# Patient Record
Sex: Male | Born: 1965 | Race: White | Hispanic: No | Marital: Married
Health system: Southern US, Community
[De-identification: ages and names within clinical notes are randomized; demographics above are authoritative.]

---

## 2002-07-17 ENCOUNTER — Encounter: Payer: Self-pay | Admitting: Occupational Medicine

## 2002-07-17 ENCOUNTER — Encounter: Admission: RE | Admit: 2002-07-17 | Discharge: 2002-07-17 | Payer: Self-pay | Admitting: Occupational Medicine

## 2002-11-30 ENCOUNTER — Encounter: Payer: Self-pay | Admitting: *Deleted

## 2002-11-30 ENCOUNTER — Encounter: Payer: Self-pay | Admitting: Emergency Medicine

## 2002-11-30 ENCOUNTER — Observation Stay (HOSPITAL_COMMUNITY): Admission: EM | Admit: 2002-11-30 | Discharge: 2002-12-01 | Payer: Self-pay | Admitting: Emergency Medicine

## 2006-12-12 ENCOUNTER — Encounter: Admission: RE | Admit: 2006-12-12 | Discharge: 2006-12-12 | Payer: Self-pay | Admitting: Gastroenterology

## 2007-01-13 ENCOUNTER — Ambulatory Visit (HOSPITAL_COMMUNITY): Admission: RE | Admit: 2007-01-13 | Discharge: 2007-01-13 | Payer: Self-pay | Admitting: Gastroenterology

## 2010-11-03 NOTE — H&P (Signed)
NAME:  Isaac Sanchez, Isaac Sanchez                     ACCOUNT NO.:  [id number]   MEDICAL RECORD NO.:  [id number]                   PATIENT TYPE:  EMS   LOCATION:  MAJO                                 FACILITY:  [facility]   PHYSICIAN:  Rollene Rotunda, M.D.                DATE OF BIRTH:  04/26/1966   DATE OF ADMISSION:  11/30/2002  DATE OF DISCHARGE:                                HISTORY & PHYSICAL   PRIMARY CARE PHYSICIAN:  None.   REASON FOR PRESENTATION:  Evaluate the patient for chest pain.   HISTORY OF PRESENT ILLNESS:  The patient is a pleasant 45 year old gentleman  who had a history of chest pain back in the late 1990s.  He was seen  apparently, by cardiologists in [facility] and had a negative stress test  per his report.  He also has a history of spontaneous left-sided  pneumothorax around that same time.  Today he presented with chest  discomfort.  This occurred while he was at work.  He was walking but not  particularly exerting himself.  He felt a left-sided chest discomfort.  He  described it as a tightness.  It was 8 out of 10 in intensity.  He felt  diaphoretic and cold.  He had lightheadedness with pre-syncope.  He did have  some tingling of his hands but he has noticed this at other times as well.  He had some nausea.  He thought this was similar to the symptoms he had 7 or  8 years ago.  A co-worker called EMS.  He was given aspirin and a sublingual  nitroglycerin.  By the time EMS had arrived the pain was 5 out of 10.  After  the sublingual nitroglycerin it eased to 2 out of 10.  A second sublingual  nitroglycerin and the pain abated.  He has had no recurrence of this.   He has otherwise been well.  Yesterday he had a headache.  However, usually  he has been feeling fine. He goes up and down stairs.  With this he cannot  bring on cardiac symptoms.   PAST MEDICAL HISTORY:  1. Spontaneous pneumothorax.  2. He has no history of hypertension, diabetes or hyperlipidemia.   PAST SURGICAL HISTORY:  None.   ALLERGIES:  Lorcet or Lortab.   MEDICATIONS:  None.   SOCIAL HISTORY:  The patient is married.  He has no children.  He works for  [employer].  He has smoked up to two packs per day.  Currently  he is smoking less than one pack per day.  He has smoked for 15 years.  He  occasionally drinks alcohol.   FAMILY HISTORY:  Noncontributory for early coronary artery disease, however,  he does not know his father or his father's cardiac history or his father's  side of the family.   REVIEW OF SYSTEMS:  As stated in the History of Present Illness and  otherwise  negative for fever, chills, night sweats, sinus trouble, cough,  nausea, vomiting, diarrhea, constipation, hematochezia, hematemesis, melena,  heat intolerance, nerves or depression.  He is cold natured.   PHYSICAL EXAMINATION:  GENERAL:  The patient is in no distress.  VITAL SIGNS:  Blood pressure of 100/70, heart rate 70 and regular.  HEENT:  Eyelids unremarkable, pupils equal, round and reactive to light,  fundi not visualized.  Oral mucosa unremarkable.  Poor dentition.  NECK:  No jugular venous distention.  Wave forms within normal limits.  Carotid upstroke brisk and symmetric without bruits.  No thyromegaly.  LYMPHATICS:  No cervical, axillary or inguinal adenopathy.  LUNGS:  Clear to auscultation bilaterally.  BACK:  No costovertebral angle tenderness.  CHEST:  Unremarkable.  HEART:  PMI displaced, though sustained.  S1 and S2 within normal limits.  No S3, no S4, no murmurs.  ABDOMEN:  Flat, taut.  Positive bowel sounds, normal in frequency and pitch.  No bruits, rebound, or guarding at the midline, no pulsatile mass, no  hepatosplenomegaly.  SKIN:  No rash, no nodules.  EXTREMITIES:  2+ pulses throughout, no edema, no cyanosis, no clubbing.  NEUROLOGIC:  Oriented to person, place and time.  Cranial nerves II through  XII grossly intact.  No motor gross defects.   LABORATORY AND  ACCESSORY DATA:  EKG--sinus rhythm with sinus arrhythmia,  axis within normal limits, intervals within normal limits, no acute ST or T  wave changes. Chest x-ray--increased lung volumes.  Cannot exclude the  possibility of a small left apical pneumothorax.  Otherwise no disease.   ASSESSMENT AND PLAN:  1. Chest.  The patient's chest discomfort has some typical features for     angina.  There was no objective of ischemia.  He does have risk factors     with tobacco.  At this time he will be put in for observation and ruled     out for myocardial infarction.  If his enzymes are negative I plan an     office stress perfusion study.  I will get an expiratory chest x-ray to     exclude the possibility of a pneumothorax.  We will check fasting lipid     profile and TSH.  2. Tobacco.  The patient will be educated about the need to stop smoking.  3. Spontaneous pneumothorax.  As above.                                               Rollene Rotunda, M.D.    JH/MEDQ  D:  11/30/2002  T:  11/30/2002  Job:  [id number]

## 2010-11-03 NOTE — Discharge Summary (Signed)
NAME:  Isaac Sanchez, Isaac Sanchez                     ACCOUNT NO.:  [id number]   MEDICAL RECORD NO.:  [id number]                   PATIENT TYPE:  INP   LOCATION:  4713                                 FACILITY:  [facility]   PHYSICIAN:  Rollene Rotunda, M.D.                DATE OF BIRTH:  1965/12/13   DATE OF ADMISSION:  11/30/2002  DATE OF DISCHARGE:  12/01/2002                           DISCHARGE SUMMARY - REFERRING   DISCHARGE DIAGNOSIS:  1. Chest pain of uncertain etiology.     A. Rule out myocardial infarction/rule out pneumothorax this admission.   SECONDARY DIAGNOSES:  1. History of spontaneous pneumothorax in the late 1990s.  2. History of prior stress testing in the late 1990s, coincident with     pneumothorax.  3. History of long-term current tobacco habituation, averaging one pack per     day.   PROCEDURES:  1. Chest x-ray on admission showing no evidence of pneumothorax.  2. End-expiratory chest x-ray, ordered by cardiologist, no evidence of     pneumothorax.  3. Cardiac enzymes, serial cardiac enzymes were troponin-I less than 0.01     times three determinations.  Also, creatine kinase within normal limits;     last determination was December 01, 2002, at 2 o'clock in the morning, 72,     and his CK-MB was 1.0, the index not registered.  4. Electrocardiogram was obtained, showed normal sinus rhythm with sinus     arrhythmia; it was a normal EKG.  5. Cholesterol panel and TSH both obtained this admission but results     pending.   DISCHARGE DISPOSITION:  Isaac Sanchez is ready for discharge, December 01, 2002, after a two-day admission at [facility].  He presented with  chest discomfort on November 30, 2002; it was predominantly left-sided.  At the  time of its occurrence, he was not particularly exerting himself.  He  described it as chest tightness with an 8/10 intensity scale.  He also felt  diaphoretic and clammy.  He had lightheadedness with presyncope.  He did  have  some tingling in his hands but has noted this at other times as well.  He also had some nausea accompanying his symptoms.  He thought this was  similar to symptoms he had seven or eight years ago.  He was admitted to  [facility] and given sublingual nitroglycerin with a drop in blood  pressure and abatement of the pain.  He was seen by Dr. Antoine Poche of [facility], who assessed Isaac Sanchez with cardiac enzymes,  electrocardiogram, and chest x-rays; the results of these tests have been  dictated above.  On the morning of December 01, 2002, Isaac Sanchez felt very  well, he was alert and oriented times three, he was not requiring oxygen  supplementation, his chest pain had abated, he had no nausea, his appetite  had returned, and he was considered for discharge December 01, 2002.  DISCHARGE MEDICATIONS:  He was started on the following medications:  Enteric-coated aspirin 325 mg daily for its anti-inflammatory and  anticoagulant properties.   PAIN MANAGEMENT:  He was not given any pain management medication.   ACTIVITY:  His activity was not restricted.   DIET:  Low-sodium, low-fat, low-cholesterol diet.   DISCHARGE INSTRUCTIONS:  He was asked to please stop using tobacco and  tobacco products.   FOLLOW UP:  He was scheduled for a stress Cardiolite study today at 12 noon  at [facility].  He was asked not to drink or eat anything prior to  the Cardiolite study and to refrain from smoking.  He requested to be called  on the telephone with the results of the Cardiolite study; of course, if it  is a positive Cardiolite study, he will have further followup with [facility].  If it is negative, he was asked to follow up with his primary  caregiver, [facility].   BRIEF HISTORY:  Isaac Sanchez is a 45 year old male with a history of chest  pain back in the late 1990s.  At that time, he was seen by a cardiologist in  [place] and had a negative  stress test.  He also has a history of a  spontaneous left-sided pneumothorax, which occurred at about the same time.  Today, he presented to [facility] with chest discomfort; this  occurred while he was at work.  He was not particularly exerting himself; he  was walking at the time.  He felt a left-sided chest discomfort, which he  describes as tightness; it was 8/10 in intensity.  He also had associated  symptoms of diaphoresis and clamminess.  He had lightheadedness with  presyncope.  He did have some tingling in his hands but has noticed this at  other times as well.  He also had nausea.  He thought this was similar to  symptoms he had in the late 1990s.  Emergency Medical Services were called,  and he was given oral aspirin and sublingual nitroglycerin; by the time the  EMS had arrived, the pain had subsided to 5/10.  After sublingual  nitroglycerin, it eased to 2/10.  A second sublingual nitroglycerin caused  resolution of his chest pain, and he has had no recurrence.  When he does  exert himself by going up and down stairs, it does not bring on any cardiac  symptoms.   PAST MEDICAL HISTORY:  Significant for a spontaneous left pneumothorax.  He  has no prior history of hypertension, diabetes, or hyperlipidemia.   FAMILY HISTORY:  He states that he has no familial history of coronary  artery disease on his mother's side; he is unaware of his father's medical  history.   HOSPITAL COURSE:  As described in the discharge disposition.  Isaac Sanchez  was admitted to [facility] on November 30, 2002, with a complaint of  chest pain.  Cardiac enzymes were obtained; these were negative.  Cholesterol panel and TSH have been taken, but they are pending.  His chest  pain resolved quickly with administration of sublingual nitroglycerin upon  admission, and this pain has not returned.  He was also ruled out for pneumothorax, and a stress Cardiolite study is scheduled for noon on  December 01, 2002, at [facility].     Maple Mirza, P.A.  Rollene Rotunda, M.D.    GM/MEDQ  D:  12/01/2002  T:  12/01/2002  Job:  [id number]   cc:   Rollene Rotunda, M.D.   [facility]    cc:   Rollene Rotunda, M.D.   [facility]

## 2010-11-03 NOTE — Discharge Summary (Signed)
   NAME:  Isaac Sanchez, Isaac Sanchez                     ACCOUNT NO.:  [id number]   MEDICAL RECORD NO.:  [id number]                   PATIENT TYPE:  INP   LOCATION:  4713                                 FACILITY:  [facility]   PHYSICIAN:  Rollene Rotunda, M.D.                DATE OF BIRTH:  1965/10/25   DATE OF ADMISSION:  11/30/2002  DATE OF DISCHARGE:  12/01/2002                           DISCHARGE SUMMARY - REFERRING   ADDENDUM:  Isaac Sanchez is a 45 year old male who was admitted overnight  for observation for chest discomfort.  He ruled out for myocardial  infarction and Dr. Antoine Poche and Dr. Eden Emms recommended an outpatient stress  Cardiolite in the office. This was arranged for December 01, 2002, at noon;  however, at the time of the patient's discharge from the hospital with plans  to go over to the office, the patient was refusing the stress test.  The  risks and benefits of further and complete cardiac evaluation were discussed  with the patient and his wife but the patient  continued to refuse to have  further evaluation.  He stated that he will call if he has any questions and  he will contact his primary care doctor for further follow-up.     Joellyn Rued, P.A. [facility]                    Rollene Rotunda, M.D.    EW/MEDQ  D:  12/01/2002  T:  12/01/2002  Job:  [id number]   cc:   [facility]    cc:   [facility]

## 2021-01-03 ENCOUNTER — Ambulatory Visit: Payer: 59 | Attending: Family Medicine | Admitting: Physical Therapy

## 2021-01-03 ENCOUNTER — Other Ambulatory Visit: Payer: Self-pay

## 2021-01-03 DIAGNOSIS — H8111 Benign paroxysmal vertigo, right ear: Secondary | ICD-10-CM | POA: Diagnosis not present

## 2021-01-03 NOTE — Patient Instructions (Signed)
How to Perform the Epley Maneuver The Epley maneuver is an exercise that relieves symptoms of vertigo. Vertigo is the feeling that you or your surroundings are moving when they are not. When you feel vertigo, you may feel like the room is spinning and may have trouble walking. The Epley maneuver is used for a type of vertigo caused by a calcium deposit in a part of the inner ear. The maneuver involves changing headpositions to help the deposit move out of the area. You can do this maneuver at home whenever you have symptoms of vertigo. You canrepeat it in 24 hours if your vertigo has not gone away. Even though the Epley maneuver may relieve your vertigo for a few weeks, it is possible that your symptoms will return. This maneuver relieves vertigo, but itdoes not relieve dizziness. What are the risks? If it is done correctly, the Epley maneuver is considered safe. Sometimes it can lead to dizziness or nausea that goes away after a short time. If you develop other symptoms--such as changes in vision, weakness, or numbness--stopdoing the maneuver and call your health care provider. Supplies needed: A bed or table. A pillow. How to do the Epley maneuver     Sit on the edge of a bed or table with your back straight and your legs extended or hanging over the edge of the bed or table. Turn your head halfway toward the affected ear or side as told by your health care provider. Lie backward quickly with your head turned until you are lying flat on your back. Your head should dangle (head-hanging position). You may want to position a pillow under your shoulders. Hold this position for at least 30 seconds. If you feel dizzy or have symptoms of vertigo, continue to hold the position until the symptoms stop. Turn your head to the opposite direction until your unaffected ear is facing down. Your head should continue to dangle. Hold this position for at least 30 seconds. If you feel dizzy or have symptoms of  vertigo, continue to hold the position until the symptoms stop. Turn your whole body to the same side as your head so that you are positioned on your side. Your head will now be nearly facedown and no longer needs to dangle. Hold for at least 30 seconds. If you feel dizzy or have symptoms of vertigo, continue to hold the position until the symptoms stop. Sit back up. You can repeat the maneuver in 24 hours if your vertigo does not go away. Follow these instructions at home: For 24 hours after doing the Epley maneuver: Keep your head in an upright position. When lying down to sleep or rest, keep your head raised (elevated) with two or more pillows. Avoid excessive neck movements. Activity Do not drive or use machinery if you feel dizzy. After doing the Epley maneuver, return to your normal activities as told by your health care provider. Ask your health care provider what activities are safe for you. General instructions Drink enough fluid to keep your urine pale yellow. Do not drink alcohol. Take over-the-counter and prescription medicines only as told by your health care provider. Keep all follow-up visits. This is important. Preventing vertigo symptoms Ask your health care provider if there is anything you should do at home to prevent vertigo. He or she may recommend that you: Keep your head elevated with two or more pillows while you sleep. Do not sleep on the side of your affected ear. Get up slowly from bed.   Avoid sudden movements during the day. Avoid extreme head positions or movement, such as looking up or bending over. Contact a health care provider if: Your vertigo gets worse. You have other symptoms, including: Nausea. Vomiting. Headache. Get help right away if you: Have vision changes. Have a headache or neck pain that is severe or getting worse. Cannot stop vomiting. Have new numbness or weakness in any part of your body. These symptoms may represent a serious problem  that is an emergency. Do not wait to see if the symptoms will go away. Get medical help right away. Call your local emergency services (911 in the [place] Do not drive yourself to the hospital. Summary Vertigo is the feeling that you or your surroundings are moving when they are not. The Epley maneuver is an exercise that relieves symptoms of vertigo. If the Epley maneuver is done correctly, it is considered safe. This information is not intended to replace advice given to you by your health care provider. Make sure you discuss any questions you have with your healthcare provider. Document Revised: 05/04/2020 Document Reviewed: 05/04/2020 Elsevier Patient Education  2022 Elsevier Inc.   Self Treatment for Right Posterior / Anterior Canalithiasis    Sitting on bed: 1. Turn head 45 right. (a) Lie back slowly, shoulders on pillow, head on bed. (b) Hold ____ seconds. 2. Keeping head on bed, turn head 90 left. Hold ____ seconds. 3. Roll to left, head on 45 angle down toward bed. Hold ____ seconds. 4. Sit up on left side of bed. Repeat ____ times per session. Do ____ sessions per day.  Copyright  VHI. All rights reserved.

## 2021-01-04 ENCOUNTER — Encounter: Payer: Self-pay | Admitting: Physical Therapy

## 2021-01-04 NOTE — Therapy (Signed)
[facility] [address] [place], [place], [address] Phone: [phone]   Fax:  [phone]  Physical Therapy Evaluation  Patient Details  Name: KABLE HAYWOOD MRN: [id number] Date of Birth: August 06, 1965 Referring Provider (PT): Carolin Coy, MD                                    Cc: Dr. Mila Palmer  Encounter Date: 01/03/2021   PT End of Session - 01/04/21 1548     Visit Number 1    Number of Visits 1    Authorization Type [employer]    PT Start Time 0800    PT Stop Time 0845    PT Time Calculation (min) 45 min    Activity Tolerance Patient tolerated treatment well    Behavior During Therapy [facility] for tasks assessed/performed             History reviewed. No pertinent past medical history.  History reviewed. No pertinent surgical history.  There were no vitals filed for this visit.    Subjective Assessment - 01/03/21 0803     Subjective Pt reports he started having vertigo on Sunday night, June 26; pt stated he started losing balance when he would walk; states he has noticed if he sits in recliner, leans forward to stand up he gets light-headed and loses his balance.  Pt reports he has noticed that top of head gets warm and feels like pressure - then spreads to front of head (does not come to forehead); sometimes some pressure behind Lt ear    Patient Stated Goals resolve the vertigo    Currently in Pain? No/denies                [facility] PT Assessment - 01/04/21 0001       Assessment   Medical Diagnosis Vertigo    Referring Provider (PT) Carolin Coy, MD    Onset Date/Surgical Date 12/11/20      Precautions   Precautions None      Balance Screen   Has the patient fallen in the past 6 months No    Has the patient had a decrease in activity level because of a fear of falling?  No    Is the patient reluctant to leave their home because of a fear of falling?  No      Prior Function    Level of Independence Independent                    Vestibular Assessment - 01/04/21 0001       Symptom Behavior   Type of Dizziness  Spinning    Frequency of Dizziness varies on head movement    Duration of Dizziness secs to minutes    Symptom Nature Positional    Aggravating Factors Looking up to the ceiling;Rolling to right    Relieving Factors Head stationary    Progression of Symptoms Better    History of similar episodes None      Positional Testing   Dix-Hallpike Dix-Hallpike Right;Dix-Hallpike Left    Horizontal Canal Testing Horizontal Canal Right;Horizontal Canal Left      Dix-Hallpike Right   Dix-Hallpike Right Duration approx. 5 secs    Dix-Hallpike Right Symptoms Upbeat, right rotatory nystagmus      Dix-Hallpike Left   Dix-Hallpike Left Duration none    Dix-Hallpike Left Symptoms No nystagmus  Horizontal Canal Right   Horizontal Canal Right Duration none      Horizontal Canal Left   Horizontal Canal Left Duration none                Objective measurements completed on examination: See above findings.      Pt was treated with 2 reps Epley maneuver for Rt BPPV, posterior canalithiasis; appeared to be fully resolved on 2nd rep         PT Education - 01/04/21 1547     Education Details instructed in Epley maneuver for self treatment prn    Person(s) Educated Patient    Methods Explanation;Demonstration;Handout    Comprehension Verbalized understanding;Returned demonstration                  No LTG's as no follow up needed at this time - pt to call for appt if BPPV not resolved       Plan - 01/04/21 1548     Clinical Impression Statement Pt had signs & symptoms consistent with Rt BPPV, posterior canalithiasis.  Treated with 2 reps of Epley maneuver and it appeared to be fully resolved on 2nd rep as no nystagmus and no vertigo reported on 2nd rep.    Personal Factors and Comorbidities Comorbidity 1     Examination-Activity Limitations Reach Overhead;Locomotion Level;Transfers    Examination-Participation Restrictions Occupation;Yard Work    Stability/Clinical Decision Making Stable/Uncomplicated    Optometrist Low    Rehab Potential Good    PT Frequency One time visit    PT Treatment/Interventions Vestibular;Patient/family education;Canalith Repostioning    Consulted and Agree with Plan of Care Patient             Patient will benefit from skilled therapeutic intervention in order to improve the following deficits and impairments:  Dizziness  Visit Diagnosis: BPPV (benign paroxysmal positional vertigo), right - Plan: PT plan of care cert/re-cert     Problem List There are no problems to display for this patient.   Kary Kos, PT 01/04/2021, 3:54 PM  [facility] [address] [place], [place], [address] Phone: [phone]   Fax:  [phone]  Name: CLOYDE OREGEL MRN: [id number] Date of Birth: 1965/12/29

## 2021-03-13 ENCOUNTER — Emergency Department (HOSPITAL_COMMUNITY): Payer: 59

## 2021-03-13 ENCOUNTER — Other Ambulatory Visit: Payer: Self-pay

## 2021-03-13 ENCOUNTER — Emergency Department (HOSPITAL_COMMUNITY)
Admission: EM | Admit: 2021-03-13 | Discharge: 2021-03-13 | Disposition: A | Discharge status: Home / Self Care | Payer: 59 | Attending: Emergency Medicine | Admitting: Emergency Medicine

## 2021-03-13 DIAGNOSIS — Y900 Blood alcohol level of less than 20 mg/100 ml: Secondary | ICD-10-CM | POA: Insufficient documentation

## 2021-03-13 DIAGNOSIS — Y92410 Unspecified street and highway as the place of occurrence of the external cause: Secondary | ICD-10-CM | POA: Diagnosis not present

## 2021-03-13 DIAGNOSIS — R0789 Other chest pain: Secondary | ICD-10-CM | POA: Diagnosis not present

## 2021-03-13 DIAGNOSIS — Z20822 Contact with and (suspected) exposure to covid-19: Secondary | ICD-10-CM | POA: Diagnosis not present

## 2021-03-13 DIAGNOSIS — M542 Cervicalgia: Secondary | ICD-10-CM | POA: Insufficient documentation

## 2021-03-13 DIAGNOSIS — M549 Dorsalgia, unspecified: Secondary | ICD-10-CM | POA: Diagnosis present

## 2021-03-13 DIAGNOSIS — S27321A Contusion of lung, unilateral, initial encounter: Secondary | ICD-10-CM

## 2021-03-13 DIAGNOSIS — R109 Unspecified abdominal pain: Secondary | ICD-10-CM | POA: Insufficient documentation

## 2021-03-13 DIAGNOSIS — R200 Anesthesia of skin: Secondary | ICD-10-CM | POA: Insufficient documentation

## 2021-03-13 DIAGNOSIS — T1490XA Injury, unspecified, initial encounter: Secondary | ICD-10-CM

## 2021-03-13 LAB — COMPREHENSIVE METABOLIC PANEL
ALT: 53 U/L — ABNORMAL HIGH (ref 0–44)
AST: 33 U/L (ref 15–41)
Albumin: 3.5 g/dL (ref 3.5–5.0)
Alkaline Phosphatase: 56 U/L (ref 38–126)
Anion gap: 10 (ref 5–15)
BUN: 17 mg/dL (ref 6–20)
CO2: 24 mmol/L (ref 22–32)
Calcium: 9.1 mg/dL (ref 8.9–10.3)
Chloride: 104 mmol/L (ref 98–111)
Creatinine, Ser: 1.17 mg/dL (ref 0.61–1.24)
GFR, Estimated: >60 mL/min (ref >60)
Glucose, Bld: 107 mg/dL — ABNORMAL HIGH (ref 70–99)
Potassium: 3.8 mmol/L (ref 3.5–5.1)
Sodium: 138 mmol/L (ref 135–145)
Total Bilirubin: 0.9 mg/dL (ref 0.3–1.2)
Total Protein: 6.5 g/dL (ref 6.5–8.1)

## 2021-03-13 LAB — PROTIME-INR
INR: 1 (ref 0.8–1.2)
Prothrombin Time: 13.2 seconds (ref 11.4–15.2)

## 2021-03-13 LAB — CBC
HCT: 45.4 % (ref 39.0–52.0)
Hemoglobin: 15.5 g/dL (ref 13.0–17.0)
MCH: 28.4 pg (ref 26.0–34.0)
MCHC: 34.1 g/dL (ref 30.0–36.0)
MCV: 83.3 fL (ref 80.0–100.0)
Platelets: 209 10*3/uL (ref 150–400)
RBC: 5.45 MIL/uL (ref 4.22–5.81)
RDW: 12.9 % (ref 11.5–15.5)
WBC: 5.5 10*3/uL (ref 4.0–10.5)
nRBC: 0 % (ref 0.0–0.2)

## 2021-03-13 LAB — I-STAT CHEM 8, ED
BUN: 17 mg/dL (ref 6–20)
Calcium, Ion: 1.09 mmol/L — ABNORMAL LOW (ref 1.15–1.40)
Chloride: 105 mmol/L (ref 98–111)
Creatinine, Ser: 1.1 mg/dL (ref 0.61–1.24)
Glucose, Bld: 103 mg/dL — ABNORMAL HIGH (ref 70–99)
HCT: 44 % (ref 39.0–52.0)
Hemoglobin: 15 g/dL (ref 13.0–17.0)
Potassium: 3.7 mmol/L (ref 3.5–5.1)
Sodium: 140 mmol/L (ref 135–145)
TCO2: 24 mmol/L (ref 22–32)

## 2021-03-13 LAB — RESP PANEL BY RT-PCR (FLU A&B, COVID) ARPGX2
Influenza A by PCR: NEGATIVE
Influenza B by PCR: NEGATIVE
SARS Coronavirus 2 by RT PCR: POSITIVE — AB

## 2021-03-13 LAB — TROPONIN I (HIGH SENSITIVITY): Troponin I (High Sensitivity): 6 ng/L (ref <18)

## 2021-03-13 LAB — ETHANOL: Alcohol, Ethyl (B): <10 mg/dL (ref <10)

## 2021-03-13 MED ORDER — NAPROXEN 250 MG PO TABS
375.0000 mg | ORAL_TABLET | Freq: Once | ORAL | Status: AC
  Administered 2021-03-13: 375 mg via ORAL
  Filled 2021-03-13: qty 2

## 2021-03-13 MED ORDER — NAPROXEN 375 MG PO TABS: 375.0000 mg | ORAL_TABLET | Freq: Two times a day (BID) | ORAL | 0 refills | Status: AC

## 2021-03-13 MED ORDER — FENTANYL CITRATE PF 50 MCG/ML IJ SOSY
50.0000 ug | PREFILLED_SYRINGE | Freq: Once | INTRAMUSCULAR | Status: AC
  Administered 2021-03-13: 50 ug via INTRAVENOUS
  Filled 2021-03-13: qty 1

## 2021-03-13 MED ORDER — NAPROXEN 375 MG PO TABS: 375.0000 mg | ORAL_TABLET | Freq: Two times a day (BID) | ORAL | 0 refills | Status: DC

## 2021-03-13 MED ORDER — IOHEXOL 350 MG/ML SOLN
100.0000 mL | Freq: Once | INTRAVENOUS | Status: AC | PRN
  Administered 2021-03-13: 100 mL via INTRAVENOUS

## 2021-03-13 MED ORDER — ONDANSETRON HCL 4 MG/2ML IJ SOLN
4.0000 mg | Freq: Once | INTRAMUSCULAR | Status: AC
  Administered 2021-03-13: 4 mg via INTRAVENOUS
  Filled 2021-03-13: qty 2
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
## 2021-03-13 NOTE — Discharge Instructions (Addendum)
You have been seen and discharged from the emergency department. Your cardiac work up was normal. Your CT imaging showed a left pulmonary contusion. This will heal on its own. Take pain medicine as directed, eat and drink with this medicine to avoid GI upset. Use Spirometer as directed. Follow-up with your primary provider for reevaluation and further care. Take home medications as prescribed. If you have any worsening symptoms or further concerns for your health please return to an emergency department for further evaluation.
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
## 2021-03-13 NOTE — ED Notes (Signed)
Patient transported to CT 
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
